# Patient Record
Sex: Female | Born: 1990 | Race: Black or African American | Hispanic: No | Marital: Single | Smoking: Former smoker
Health system: Southern US, Community
[De-identification: ages and names within clinical notes are randomized; demographics above are authoritative.]

## PROBLEM LIST (undated history)

## (undated) HISTORY — PX: TONSILLECTOMY: SUR1361

---

## 2011-10-23 ENCOUNTER — Encounter (HOSPITAL_COMMUNITY): Payer: Self-pay

## 2011-10-23 ENCOUNTER — Emergency Department (HOSPITAL_COMMUNITY)
Admission: EM | Admit: 2011-10-23 | Discharge: 2011-10-23 | Disposition: A | Discharge status: Home / Self Care | Payer: Self-pay | Attending: Emergency Medicine | Admitting: Emergency Medicine

## 2011-10-23 DIAGNOSIS — N760 Acute vaginitis: Secondary | ICD-10-CM | POA: Insufficient documentation

## 2011-10-23 DIAGNOSIS — J45909 Unspecified asthma, uncomplicated: Secondary | ICD-10-CM | POA: Insufficient documentation

## 2011-10-23 DIAGNOSIS — R197 Diarrhea, unspecified: Secondary | ICD-10-CM | POA: Insufficient documentation

## 2011-10-23 DIAGNOSIS — A499 Bacterial infection, unspecified: Secondary | ICD-10-CM | POA: Insufficient documentation

## 2011-10-23 DIAGNOSIS — B9689 Other specified bacterial agents as the cause of diseases classified elsewhere: Secondary | ICD-10-CM | POA: Insufficient documentation

## 2011-10-23 LAB — POCT PREGNANCY, URINE: Preg Test, Ur: NEGATIVE

## 2011-10-23 LAB — DIFFERENTIAL
Basophils Absolute: 0 10*3/uL (ref 0.0–0.1)
Eosinophils Relative: 1 % (ref 0–5)
Lymphocytes Relative: 31 % (ref 12–46)
Lymphs Abs: 2.7 10*3/uL (ref 0.7–4.0)
Monocytes Absolute: 0.6 10*3/uL (ref 0.1–1.0)
Monocytes Relative: 7 % (ref 3–12)
Neutro Abs: 5.2 10*3/uL (ref 1.7–7.7)

## 2011-10-23 LAB — BASIC METABOLIC PANEL
BUN: 7 mg/dL (ref 6–23)
CO2: 21 mEq/L (ref 19–32)
Calcium: 9.6 mg/dL (ref 8.4–10.5)
Chloride: 106 mEq/L (ref 96–112)
Creatinine, Ser: 0.55 mg/dL (ref 0.50–1.10)
Glucose, Bld: 117 mg/dL — ABNORMAL HIGH (ref 70–99)

## 2011-10-23 LAB — URINALYSIS, ROUTINE W REFLEX MICROSCOPIC
Bilirubin Urine: NEGATIVE
Glucose, UA: NEGATIVE mg/dL
Hgb urine dipstick: NEGATIVE
Specific Gravity, Urine: 1.03 (ref 1.005–1.030)
Urobilinogen, UA: 0.2 mg/dL (ref 0.0–1.0)

## 2011-10-23 LAB — CBC
HCT: 42 % (ref 36.0–46.0)
Hemoglobin: 13.8 g/dL (ref 12.0–15.0)
MCV: 79.8 fL (ref 78.0–100.0)
RBC: 5.26 MIL/uL — ABNORMAL HIGH (ref 3.87–5.11)
RDW: 14.3 % (ref 11.5–15.5)
WBC: 8.6 10*3/uL (ref 4.0–10.5)

## 2011-10-23 LAB — WET PREP, GENITAL
Trich, Wet Prep: NONE SEEN
Yeast Wet Prep HPF POC: NONE SEEN

## 2011-10-23 LAB — OCCULT BLOOD, POC DEVICE: Fecal Occult Bld: NEGATIVE

## 2011-10-23 MED ORDER — OXYCODONE-ACETAMINOPHEN 5-325 MG PO TABS: 1.0000 | ORAL_TABLET | ORAL | Status: DC | PRN

## 2011-10-23 MED ORDER — OXYCODONE-ACETAMINOPHEN 5-325 MG PO TABS: 1.0000 | ORAL_TABLET | ORAL | Status: AC | PRN

## 2011-10-23 MED ORDER — MORPHINE SULFATE 4 MG/ML IJ SOLN
4.0000 mg | Freq: Once | INTRAMUSCULAR | Status: AC
  Administered 2011-10-23: 4 mg via INTRAVENOUS
  Filled 2011-10-23: qty 1

## 2011-10-23 MED ORDER — METRONIDAZOLE 500 MG PO TABS: 500.0000 mg | ORAL_TABLET | Freq: Two times a day (BID) | ORAL | Status: AC

## 2011-10-23 MED ORDER — SODIUM CHLORIDE 0.9 % IV SOLN
Freq: Once | INTRAVENOUS | Status: AC
  Administered 2011-10-23: 10:00:00 via INTRAVENOUS

## 2011-10-23 MED ORDER — ONDANSETRON HCL 4 MG/2ML IJ SOLN
4.0000 mg | Freq: Once | INTRAMUSCULAR | Status: AC
  Administered 2011-10-23: 4 mg via INTRAVENOUS
  Filled 2011-10-23: qty 2

## 2011-10-23 NOTE — ED Provider Notes (Signed)
History   This chart was scribed for Susan Gaskins, MD by Clarita Crane. The patient was seen in room APA04/APA04. Patient's care was started at 0850.    CSN: [id number]  Arrival date & time 10/23/11  6644   First MD Initiated Contact with Patient 10/23/11 [phone]      Chief Complaint  Patient presents with  . Abdominal Pain    HPI Susan Curry is a 21 y.o. female who presents to the Emergency Department complaining of constant severe abdominal pain onset last night and persistent since with associated dysuria, hematochezia, nausea, diarrhea, hot flash and lower back pain. Patient reports abdominal pain is aggravated while having or attempting to have a bowel movement as well as sitting and is minimally relieved with standing. Also reports that she experiences an intense pressure within her abdomen when attempting to have a bowel movement.  Denies vomiting, fever, vaginal bleeding and h/o similar symptoms. Patient with h/o asthma and is a current smoker. Denies h/o abdominal surgery.   Past Medical History  Diagnosis Date  . Asthma     Past Surgical History  Procedure Date  . Tonsillectomy     No family history on file.  History  Substance Use Topics  . Smoking status: Current Everyday Smoker    Types: Cigarettes  . Smokeless tobacco: Not on file  . Alcohol Use: Yes     occ    OB History    Grav Para Term Preterm Abortions TAB SAB Ect Mult Living                  Review of Systems A complete 10 system review of systems was obtained and all systems are negative except as noted in the HPI and PMH.   Allergies  Review of patient's allergies indicates no known allergies.  Home Medications   Current Outpatient Rx  Name Route Sig Dispense Refill  . ALBUTEROL SULFATE HFA 108 (90 BASE) MCG/ACT IN AERS Inhalation Inhale 2 puffs into the lungs every 6 (six) hours as needed. FOR SHORTNESS OF BREATH    . ETONOGESTREL 68 MG Valders IMPL Subcutaneous Inject 1 each into  the skin once.    Marland Kitchen METRONIDAZOLE 500 MG PO TABS Oral Take 1 tablet (500 mg total) by mouth 2 (two) times daily. 14 tablet 0  . OXYCODONE-ACETAMINOPHEN 5-325 MG PO TABS Oral Take 1 tablet by mouth every 4 (four) hours as needed for pain. 5 tablet 0    BP 103/62  Pulse 58  Temp(Src) 98.1 F (36.7 C) (Oral)  Resp 18  Ht 5\' 4"  (1.626 m)  Wt 210 lb (95.255 kg)  BMI 36.05 kg/m2  SpO2 98%  Physical Exam CONSTITUTIONAL: Well developed/well nourished HEAD AND FACE: Normocephalic/atraumatic EYES: EOMI/PERRL ENMT: Mucous membranes moist NECK: supple no meningeal signs SPINE:entire spine nontender CV: S1/S2 noted, no murmurs/rubs/gallops noted LUNGS: Lungs are clear to auscultation bilaterally, no apparent distress ABDOMEN: soft, no rebound or guarding, diffuse abdominal tenderness RECTAL: chaperone present for exam, Stool color normal, no hemorrhoids noted, no mass, no abscess PELVIC: Chaperone present for exam. No cmt, no adnexal tenderness or mass, no vaginal bleeding, minimal discharge NEURO: Pt is awake/alert, moves all extremitiesx4 EXTREMITIES: pulses normal, full ROM SKIN: warm, color normal PSYCH: no abnormalities of mood noted   ED Course  Procedures   DIAGNOSTIC STUDIES: Oxygen Saturation is 100% on room air, normal by my interpretation.    COORDINATION OF CARE: 9:59AM- Rectal exam performed with chaperone present. Plan for evaluation  and treatment discussed.  10:49AM- Patient informed of current lab results. States she is feeling improved at this time following administration of 4mg  Morphine and 4mg  Zofran via IV. 11:03AM- Pelvic exam performed by Susan Gaskins, MD,  Pt improved well appearing no distress, stable for d/c The patient appears reasonably screened and/or stabilized for discharge and I doubt any other medical condition or other [facility] requiring further screening, evaluation, or treatment in the ED at this time prior to discharge.   Results for orders  placed during the hospital encounter of 10/23/11  URINALYSIS, ROUTINE W REFLEX MICROSCOPIC      Component Value Range   Color, Urine YELLOW  YELLOW    APPearance HAZY (*) CLEAR    Specific Gravity, Urine 1.030  1.005 - 1.030    pH 5.5  5.0 - 8.0    Glucose, UA NEGATIVE  NEGATIVE (mg/dL)   Hgb urine dipstick NEGATIVE  NEGATIVE    Bilirubin Urine NEGATIVE  NEGATIVE    Ketones, ur NEGATIVE  NEGATIVE (mg/dL)   Protein, ur NEGATIVE  NEGATIVE (mg/dL)   Urobilinogen, UA 0.2  0.0 - 1.0 (mg/dL)   Nitrite NEGATIVE  NEGATIVE    Leukocytes, UA NEGATIVE  NEGATIVE   POCT PREGNANCY, URINE      Component Value Range   Preg Test, Ur NEGATIVE  NEGATIVE   CBC      Component Value Range   WBC 8.6  4.0 - 10.5 (K/uL)   RBC 5.26 (*) 3.87 - 5.11 (MIL/uL)   Hemoglobin 13.8  12.0 - 15.0 (g/dL)   HCT 16.1  09.6 - 04.5 (%)   MCV 79.8  78.0 - 100.0 (fL)   MCH 26.2  26.0 - 34.0 (pg)   MCHC 32.9  30.0 - 36.0 (g/dL)   RDW 40.9  81.1 - 91.4 (%)   Platelets 258  150 - 400 (K/uL)  DIFFERENTIAL      Component Value Range   Neutrophils Relative 61  43 - 77 (%)   Neutro Abs 5.2  1.7 - 7.7 (K/uL)   Lymphocytes Relative 31  12 - 46 (%)   Lymphs Abs 2.7  0.7 - 4.0 (K/uL)   Monocytes Relative 7  3 - 12 (%)   Monocytes Absolute 0.6  0.1 - 1.0 (K/uL)   Eosinophils Relative 1  0 - 5 (%)   Eosinophils Absolute 0.1  0.0 - 0.7 (K/uL)   Basophils Relative 0  0 - 1 (%)   Basophils Absolute 0.0  0.0 - 0.1 (K/uL)  BASIC METABOLIC PANEL      Component Value Range   Sodium 138  135 - 145 (mEq/L)   Potassium 3.8  3.5 - 5.1 (mEq/L)   Chloride 106  96 - 112 (mEq/L)   CO2 21  19 - 32 (mEq/L)   Glucose, Bld 117 (*) 70 - 99 (mg/dL)   BUN 7  6 - 23 (mg/dL)   Creatinine, Ser 7.82  0.50 - 1.10 (mg/dL)   Calcium 9.6  8.4 - 95.6 (mg/dL)   GFR calc non Af Amer >90  >90 (mL/min)   GFR calc Af Amer >90  >90 (mL/min)  OCCULT BLOOD, POC DEVICE      Component Value Range   Fecal Occult Bld NEGATIVE    WET PREP, GENITAL       Component Value Range   Yeast Wet Prep HPF POC NONE SEEN  NONE SEEN    Trich, Wet Prep NONE SEEN  NONE SEEN    Clue  Cells Wet Prep HPF POC MANY (*) NONE SEEN    WBC, Wet Prep HPF POC MANY (*) NONE SEEN       1. Abdominal pain   2. Bacterial vaginosis   3. Diarrhea       MDM  Nursing notes reviewed and considered in documentation All labs/vitals reviewed and considered       I personally performed the services described in this documentation, which was scribed in my presence. The recorded information has been reviewed and considered.      Susan Gaskins, MD 10/23/11 1136

## 2011-10-23 NOTE — ED Notes (Signed)
Water given  

## 2011-10-23 NOTE — Discharge Instructions (Signed)

## 2011-10-23 NOTE — ED Notes (Signed)
Attempted to call for triage was n restroom.

## 2011-10-23 NOTE — ED Notes (Signed)
Ab pain that started last night, last normal bm 3 days ago, having "trouble w/ bowels", "feels like she has to go but nothing comes out and when it does its hard"

## 2013-08-25 ENCOUNTER — Emergency Department (HOSPITAL_COMMUNITY)
Admission: EM | Admit: 2013-08-25 | Discharge: 2013-08-25 | Disposition: A | Discharge status: Home / Self Care | Payer: Managed Care, Other (non HMO) | Attending: Emergency Medicine | Admitting: Emergency Medicine

## 2013-08-25 ENCOUNTER — Encounter (HOSPITAL_COMMUNITY): Payer: Self-pay | Admitting: Emergency Medicine

## 2013-08-25 ENCOUNTER — Emergency Department (HOSPITAL_COMMUNITY): Payer: Managed Care, Other (non HMO)

## 2013-08-25 DIAGNOSIS — J209 Acute bronchitis, unspecified: Secondary | ICD-10-CM | POA: Insufficient documentation

## 2013-08-25 DIAGNOSIS — Z79899 Other long term (current) drug therapy: Secondary | ICD-10-CM | POA: Insufficient documentation

## 2013-08-25 DIAGNOSIS — R51 Headache: Secondary | ICD-10-CM | POA: Insufficient documentation

## 2013-08-25 DIAGNOSIS — J40 Bronchitis, not specified as acute or chronic: Secondary | ICD-10-CM

## 2013-08-25 DIAGNOSIS — F17200 Nicotine dependence, unspecified, uncomplicated: Secondary | ICD-10-CM | POA: Insufficient documentation

## 2013-08-25 DIAGNOSIS — J45909 Unspecified asthma, uncomplicated: Secondary | ICD-10-CM | POA: Insufficient documentation

## 2013-08-25 MED ORDER — HYDROCODONE-ACETAMINOPHEN 5-325 MG PO TABS
1.0000 | ORAL_TABLET | Freq: Once | ORAL | Status: AC
  Administered 2013-08-25: 1 via ORAL
  Filled 2013-08-25: qty 1

## 2013-08-25 MED ORDER — HYDROCODONE-ACETAMINOPHEN 5-325 MG PO TABS: 1.0000 | ORAL_TABLET | Freq: Four times a day (QID) | ORAL | Status: DC | PRN

## 2013-08-25 MED ORDER — AMOXICILLIN 500 MG PO CAPS: 500.0000 mg | ORAL_CAPSULE | Freq: Three times a day (TID) | ORAL | Status: DC

## 2013-08-25 NOTE — ED Notes (Signed)
Pt c/o headache, being "cold", n/v X1, chest pain that started while walking her daughter to school this am, pt reports that her daughter has been sick with cold symptoms as well,

## 2013-08-25 NOTE — ED Notes (Signed)
Sudden onset of left sided chest tightness and headache while on the way to work this morning.  Denies sob/cough/fever.

## 2013-08-25 NOTE — ED Provider Notes (Signed)
CSN: [id number]     Arrival date & time 08/25/13  0718 History   First MD Initiated Contact with Patient 08/25/13 [phone]     Chief Complaint  Patient presents with  . Chest Pain  . Headache     (Consider location/radiation/quality/duration/timing/severity/associated sxs/prior Treatment) Patient is a 23 y.o. female presenting with chest pain and headaches. The history is provided by the patient (the pt complains of cough).  Chest Pain Pain location:  L chest Pain quality: aching   Pain radiates to:  Does not radiate Pain radiates to the back: no   Pain severity:  Mild Onset quality:  Gradual Timing:  Intermittent Progression:  Waxing and waning Associated symptoms: cough and headache   Associated symptoms: no abdominal pain, no back pain and no fatigue   Headache Associated symptoms: cough   Associated symptoms: no abdominal pain, no back pain, no congestion, no diarrhea, no fatigue, no seizures and no sinus pressure     Past Medical History  Diagnosis Date  . Asthma    Past Surgical History  Procedure Laterality Date  . Tonsillectomy     No family history on file. History  Substance Use Topics  . Smoking status: Current Every Day Smoker    Types: Cigarettes  . Smokeless tobacco: Not on file  . Alcohol Use: Yes     Comment: occ   OB History   Grav Para Term Preterm Abortions TAB SAB Ect Mult Living                 Review of Systems  Constitutional: Negative for appetite change and fatigue.  HENT: Negative for congestion, ear discharge and sinus pressure.   Eyes: Negative for discharge.  Respiratory: Positive for cough.   Cardiovascular: Positive for chest pain.  Gastrointestinal: Negative for abdominal pain and diarrhea.  Genitourinary: Negative for frequency and hematuria.  Musculoskeletal: Negative for back pain.  Skin: Negative for rash.  Neurological: Positive for headaches. Negative for seizures.  Psychiatric/Behavioral: Negative for hallucinations.       Allergies  Review of patient's allergies indicates no known allergies.  Home Medications   Current Outpatient Rx  Name  Route  Sig  Dispense  Refill  . albuterol (PROVENTIL HFA;VENTOLIN HFA) 108 (90 BASE) MCG/ACT inhaler   Inhalation   Inhale 2 puffs into the lungs every 6 (six) hours as needed. FOR SHORTNESS OF BREATH         . amoxicillin (AMOXIL) 500 MG capsule   Oral   Take 1 capsule (500 mg total) by mouth 3 (three) times daily.   21 capsule   0   . etonogestrel (IMPLANON) 68 MG IMPL implant   Subcutaneous   Inject 1 each into the skin once.         Marland Kitchen HYDROcodone-acetaminophen (NORCO/VICODIN) 5-325 MG per tablet   Oral   Take 1 tablet by mouth every 6 (six) hours as needed for moderate pain.   15 tablet   0    BP 108/62  Pulse 70  Temp(Src) 97.9 F (36.6 C) (Oral)  Resp 16  Ht 5\' 4"  (1.626 m)  Wt 180 lb (81.647 kg)  BMI 30.88 kg/m2  SpO2 100%  LMP 08/06/2013 Physical Exam  Constitutional: She is oriented to person, place, and time. She appears well-developed.  HENT:  Head: Normocephalic.  Eyes: Conjunctivae and EOM are normal. No scleral icterus.  Neck: Neck supple. No thyromegaly present.  Cardiovascular: Normal rate and regular rhythm.  Exam reveals no gallop and  no friction rub.   No murmur heard. Pulmonary/Chest: No stridor. She has no wheezes. She has no rales. She exhibits no tenderness.  Abdominal: She exhibits no distension. There is no tenderness. There is no rebound.  Musculoskeletal: Normal range of motion. She exhibits no edema.  Lymphadenopathy:    She has no cervical adenopathy.  Neurological: She is oriented to person, place, and time. She exhibits normal muscle tone. Coordination normal.  Skin: No rash noted. No erythema.  Psychiatric: She has a normal mood and affect. Her behavior is normal.    ED Course  Procedures (including critical care time) Labs Review Labs Reviewed - No data to display Imaging Review Dg Chest 2  View  08/25/2013   CLINICAL DATA:  Chills and left-sided chest pain.  EXAM: CHEST  2 VIEW  COMPARISON:  None.  FINDINGS: The lungs are adequately inflated and clear. The cardiac silhouette is normal in size. The pulmonary vascularity is not engorged. There is no pleural effusion or pneumothorax. The trachea is midline. The observed portions of the bony thorax appear normal.  IMPRESSION: There is no evidence of pneumonia nor other acute cardiopulmonary disease. One cannot exclude acute bronchitis in the appropriate clinical setting.   Electronically Signed   By: David  Swazil[place]   On: 08/25/2013 08:11     EKG Interpretation   Date/Time:  Tuesday August 25 2013 07:37:18 EDT Ventricular Rate:  66 PR Interval:  146 QRS Duration: 88 QT Interval:  390 QTC Calculation: 408 R Axis:   83 Text Interpretation:  Normal sinus rhythm Normal ECG No previous ECGs  available Confirmed by Nesha Counihan  MD, Raymundo Rout ([id number]) on 08/25/2013 8:20:07 AM      MDM   Final diagnoses:  Bronchitis        Benny LennertJoseph L Cortlynn Hollinsworth, MD 08/25/13 [phone]

## 2013-08-25 NOTE — Discharge Instructions (Signed)
Follow if not improving.  Plenty of fluids.  Tylenol for fever

## 2016-03-05 ENCOUNTER — Other Ambulatory Visit: Payer: Self-pay | Admitting: Medical Oncology

## 2016-03-05 DIAGNOSIS — N6011 Diffuse cystic mastopathy of right breast: Secondary | ICD-10-CM

## 2016-03-05 DIAGNOSIS — N6012 Diffuse cystic mastopathy of left breast: Principal | ICD-10-CM

## 2016-06-26 ENCOUNTER — Encounter: Payer: Self-pay | Admitting: Emergency Medicine

## 2016-06-26 ENCOUNTER — Ambulatory Visit
Admission: EM | Admit: 2016-06-26 | Discharge: 2016-06-26 | Disposition: A | Discharge status: Home / Self Care | Payer: BC Managed Care – PPO | Attending: Family Medicine | Admitting: Family Medicine

## 2016-06-26 DIAGNOSIS — Z3A01 Less than 8 weeks gestation of pregnancy: Secondary | ICD-10-CM

## 2016-06-26 DIAGNOSIS — R112 Nausea with vomiting, unspecified: Secondary | ICD-10-CM

## 2016-06-26 DIAGNOSIS — Z3201 Encounter for pregnancy test, result positive: Secondary | ICD-10-CM

## 2016-06-26 DIAGNOSIS — R110 Nausea: Secondary | ICD-10-CM | POA: Diagnosis not present

## 2016-06-26 LAB — PREGNANCY, URINE: PREG TEST UR: POSITIVE — AB

## 2016-06-26 MED ORDER — ONDANSETRON 8 MG PO TBDP: 8.0000 mg | ORAL_TABLET | Freq: Two times a day (BID) | ORAL | 0 refills | Status: DC

## 2016-06-26 NOTE — ED Provider Notes (Signed)
CSN: [id number]     Arrival date & time 06/26/16  1611 History   First MD Initiated Contact with Patient 06/26/16 1739     Chief Complaint  Patient presents with  . Nausea   (Consider location/radiation/quality/duration/timing/severity/associated sxs/prior Treatment) HPI  The 26 year old female resents with nausea vomiting and weakness for 1 week. As the nausea vomiting she has also anorectic. Been complaining of dizziness and lightheadedness as well as some lower abdominal cramping. She's had no vaginal discharge no spotting no fever no diarrhea her last menstrual period was the beginning of December. Currently has no OB/GYN. I primary care is through [facility]       Past Medical History:  Diagnosis Date  . Asthma    Past Surgical History:  Procedure Laterality Date  . TONSILLECTOMY     History reviewed. No pertinent family history. Social History  Substance Use Topics  . Smoking status: Current Every Day Smoker    Types: Cigarettes  . Smokeless tobacco: Never Used  . Alcohol use Yes     Comment: occ   OB History    Gravida Para Term Preterm AB Living   1             SAB TAB Ectopic Multiple Live Births                 Review of Systems  Constitutional: Positive for activity change, appetite change and fever.  Gastrointestinal: Positive for nausea. Negative for diarrhea.  All other systems reviewed and are negative.   Allergies  Patient has no known allergies.  Home Medications   Prior to Admission medications   Medication Sig Start Date End Date Taking? Authorizing Provider  ondansetron (ZOFRAN ODT) 8 MG disintegrating tablet Take 1 tablet (8 mg total) by mouth 2 (two) times daily. 06/26/16   Lutricia FeilWilliam P Truth Barot, PA-C   Meds Ordered and Administered this Visit  Medications - No data to display  BP 103/61 (BP Location: Left Arm)   Pulse 68   Temp 97.5 F (36.4 C) (Tympanic)   Resp 16   Ht 5\' 4"  (1.626 m)   Wt 215 lb (97.5 kg)   LMP 05/03/2016    SpO2 100%   BMI 36.90 kg/m  No data found.   Physical Exam  Constitutional: She is oriented to person, place, and time. She appears well-developed and well-nourished. No distress.  HENT:  Head: Normocephalic and atraumatic.  Eyes: EOM are normal. Pupils are equal, round, and reactive to light.  Neck: Normal range of motion. Neck supple.  Pulmonary/Chest: Effort normal and breath sounds normal. No respiratory distress. She has no wheezes. She has no rales.  Abdominal: Soft. Bowel sounds are normal. She exhibits no distension. There is no tenderness. There is no guarding.  Musculoskeletal: Normal range of motion.  Neurological: She is alert and oriented to person, place, and time.  Skin: Skin is warm and dry. She is not diaphoretic.  Psychiatric: She has a normal mood and affect. Her behavior is normal. Judgment and thought content normal.  Nursing note and vitals reviewed.   Urgent Care Course     Procedures (including critical care time)  Labs Review Labs Reviewed  PREGNANCY, URINE - Abnormal; Notable for the following:       Result Value   Preg Test, Ur POSITIVE (*)    All other components within normal limits    Imaging Review No results found.   Visual Acuity Review  Right Eye Distance:  Left Eye Distance:   Bilateral Distance:    Right Eye Near:   Left Eye Near:    Bilateral Near:         MDM   1. Nausea   2. Non-intractable vomiting with nausea, unspecified vomiting type   3. Less than [redacted] weeks gestation of pregnancy    Discharge Medication List as of 06/26/2016  5:58 PM    START taking these medications   Details  ondansetron (ZOFRAN ODT) 8 MG disintegrating tablet Take 1 tablet (8 mg total) by mouth 2 (two) times daily., Starting Tue 06/26/2016, Normal      Plan: 1. Test/x-ray results and diagnosis reviewed with patient 2. rx as per orders; risks, benefits, potential side effects reviewed with patient 3. Recommend supportive treatment with  Consulting with an OB/GYN as soon as possible. Stop the melatonin at this time since it is not recommended in early pregnancy. I will prescribe Zofran for her nausea and vomiting. 4. F/u prn if symptoms worsen or don't improve     Lutricia Feil, PA-C 06/26/16 2135

## 2016-06-26 NOTE — ED Triage Notes (Signed)
Nausea, vomiting for over a week

## 2016-07-03 DIAGNOSIS — R87610 Atypical squamous cells of undetermined significance on cytologic smear of cervix (ASC-US): Secondary | ICD-10-CM | POA: Insufficient documentation

## 2016-07-03 DIAGNOSIS — O200 Threatened abortion: Secondary | ICD-10-CM | POA: Insufficient documentation

## 2016-07-10 ENCOUNTER — Ambulatory Visit: Payer: Managed Care, Other (non HMO) | Attending: Medical Oncology

## 2016-07-24 ENCOUNTER — Encounter: Payer: Self-pay | Admitting: Medical Oncology

## 2016-07-24 ENCOUNTER — Emergency Department: Payer: BC Managed Care – PPO

## 2016-07-24 ENCOUNTER — Emergency Department
Admission: EM | Admit: 2016-07-24 | Discharge: 2016-07-24 | Disposition: A | Discharge status: Home / Self Care | Payer: BC Managed Care – PPO | Attending: Emergency Medicine | Admitting: Emergency Medicine

## 2016-07-24 DIAGNOSIS — F17210 Nicotine dependence, cigarettes, uncomplicated: Secondary | ICD-10-CM | POA: Diagnosis not present

## 2016-07-24 DIAGNOSIS — J45909 Unspecified asthma, uncomplicated: Secondary | ICD-10-CM | POA: Insufficient documentation

## 2016-07-24 DIAGNOSIS — R197 Diarrhea, unspecified: Secondary | ICD-10-CM

## 2016-07-24 DIAGNOSIS — O219 Vomiting of pregnancy, unspecified: Secondary | ICD-10-CM | POA: Diagnosis present

## 2016-07-24 DIAGNOSIS — O200 Threatened abortion: Secondary | ICD-10-CM | POA: Diagnosis not present

## 2016-07-24 DIAGNOSIS — O99331 Smoking (tobacco) complicating pregnancy, first trimester: Secondary | ICD-10-CM | POA: Insufficient documentation

## 2016-07-24 DIAGNOSIS — R112 Nausea with vomiting, unspecified: Secondary | ICD-10-CM

## 2016-07-24 LAB — COMPREHENSIVE METABOLIC PANEL
ALT: 35 U/L (ref 14–54)
ANION GAP: 9 (ref 5–15)
AST: 26 U/L (ref 15–41)
Albumin: 3.8 g/dL (ref 3.5–5.0)
Alkaline Phosphatase: 55 U/L (ref 38–126)
BILIRUBIN TOTAL: 0.6 mg/dL (ref 0.3–1.2)
CO2: 23 mmol/L (ref 22–32)
Calcium: 9.3 mg/dL (ref 8.9–10.3)
Chloride: 104 mmol/L (ref 101–111)
Creatinine, Ser: 0.55 mg/dL (ref 0.44–1.00)
Glucose, Bld: 107 mg/dL — ABNORMAL HIGH (ref 65–99)
POTASSIUM: 3.5 mmol/L (ref 3.5–5.1)
Sodium: 136 mmol/L (ref 135–145)
TOTAL PROTEIN: 7.7 g/dL (ref 6.5–8.1)

## 2016-07-24 LAB — HCG, QUANTITATIVE, PREGNANCY: HCG, BETA CHAIN, QUANT, S: 88610 m[IU]/mL — AB (ref <5)

## 2016-07-24 LAB — LIPASE, BLOOD: Lipase: 21 U/L (ref 11–51)

## 2016-07-24 LAB — URINALYSIS, COMPLETE (UACMP) WITH MICROSCOPIC
BACTERIA UA: NONE SEEN
BILIRUBIN URINE: NEGATIVE
Glucose, UA: NEGATIVE mg/dL
HGB URINE DIPSTICK: NEGATIVE
Ketones, ur: 80 mg/dL — AB
Leukocytes, UA: NEGATIVE
Nitrite: NEGATIVE
Protein, ur: 30 mg/dL — AB
SPECIFIC GRAVITY, URINE: 1.025 (ref 1.005–1.030)
pH: 5 (ref 5.0–8.0)

## 2016-07-24 LAB — CBC
HEMATOCRIT: 39.9 % (ref 35.0–47.0)
HEMOGLOBIN: 13.3 g/dL (ref 12.0–16.0)
MCH: 25.8 pg — ABNORMAL LOW (ref 26.0–34.0)
MCHC: 33.3 g/dL (ref 32.0–36.0)
MCV: 77.5 fL — ABNORMAL LOW (ref 80.0–100.0)
Platelets: 278 10*3/uL (ref 150–440)
RBC: 5.14 MIL/uL (ref 3.80–5.20)
RDW: 14.6 % — AB (ref 11.5–14.5)
WBC: 8.3 10*3/uL (ref 3.6–11.0)

## 2016-07-24 LAB — ABO/RH: ABO/RH(D): B POS

## 2016-07-24 MED ORDER — ACETAMINOPHEN 500 MG PO TABS
1000.0000 mg | ORAL_TABLET | Freq: Once | ORAL | Status: AC
  Administered 2016-07-24: 1000 mg via ORAL
  Filled 2016-07-24: qty 2

## 2016-07-24 MED ORDER — SODIUM CHLORIDE 0.9 % IV BOLUS (SEPSIS)
1000.0000 mL | INTRAVENOUS | Status: AC
  Administered 2016-07-24: 1000 mL via INTRAVENOUS

## 2016-07-24 MED ORDER — ONDANSETRON HCL 4 MG/2ML IJ SOLN
4.0000 mg | INTRAMUSCULAR | Status: AC
  Administered 2016-07-24: 4 mg via INTRAVENOUS
  Filled 2016-07-24: qty 2

## 2016-07-24 NOTE — ED Notes (Signed)
Pt unable to urinate at this time, given specimen cup for when is able to void and sent back out to lobby.

## 2016-07-24 NOTE — ED Provider Notes (Signed)
[facility] Emergency Department Provider Note  ____________________________________________   First MD Initiated Contact with Patient 07/24/16 1538     (approximate)  I have reviewed the triage vital signs and the nursing notes.   HISTORY  Chief Complaint Emesis    HPI Susan Curry is a 26 y.o. female G2 P1 at [redacted] weeks gestation who goes to an OB/GYN in [place].  She presents for evaluation of a constellation of symptoms including vomiting, diarrhea, and some vaginal spotting.  She reports that she has had severe nausea and vomiting throughout her pregnancy and takes both Zofran and Diclegis.   She reports that multiple members of her family had diarrhea recently and they assume that they ate something bad or have a virus, but she woke up acutely at 5:30 AM with some mild abdominal cramping, diarrhea, and persistent vomiting.  She reports that she has had at least 12 episodes of vomiting and diarrhea today.  What concerned her was that she had some vaginal spotting as well.  She noticed the blood when she wiped after urinating.  She thinks that she might of had some blood in her stool as well but since she is pregnant and she is most concerned about the possibility of vaginal blood.  She denies fever/chills, chest pain, shortness of breath.  She states the symptoms of vomiting and diarrhea are severe but the vaginal bleeding is mild.  Nothing makes her symptoms better or worse.  She also reports a generalized headache that is severe and throbbing.  Past Medical History:  Diagnosis Date  . Asthma     There are no active problems to display for this patient.   Past Surgical History:  Procedure Laterality Date  . TONSILLECTOMY      Prior to Admission medications   Medication Sig Start Date End Date Taking? Authorizing Provider  ondansetron (ZOFRAN ODT) 8 MG disintegrating tablet Take 1 tablet (8 mg total) by mouth 2 (two) times daily. 06/26/16    Lutricia Feil, PA-C    Allergies Patient has no known allergies.  No family history on file.  Social History Social History  Substance Use Topics  . Smoking status: Current Every Day Smoker    Types: Cigarettes  . Smokeless tobacco: Never Used  . Alcohol use Yes     Comment: occ    Review of Systems Constitutional: No fever/chills Eyes: No visual changes. ENT: No sore throat. Cardiovascular: Denies chest pain. Respiratory: Denies shortness of breath. Gastrointestinal: Mild abdominal cramping with numerous episodes of N/V/D since during the night last night Genitourinary: Several episodes of spotting.  Negative for dysuria. Musculoskeletal: Negative for back pain. Skin: Negative for rash. Neurological: Negative for headaches, focal weakness or numbness.  10-point ROS otherwise negative.  ____________________________________________   PHYSICAL EXAM:  VITAL SIGNS: ED Triage Vitals [07/24/16 1347]  Enc Vitals Group     BP (!) 141/105     Pulse Rate 97     Resp 18     Temp 98.7 F (37.1 C)     Temp Source Oral     SpO2 99 %     Weight 198 lb (89.8 kg)     Height 5\' 4"  (1.626 m)     Head Circumference      Peak Flow      Pain Score 10     Pain Loc      Pain Edu?      Excl. in GC?  Constitutional: Alert and oriented. Well appearing and in no acute distress. Eyes: Conjunctivae are normal. PERRL. EOMI. Head: Atraumatic. Nose: No congestion/rhinnorhea. Mouth/Throat: Mucous membranes are moist. Neck: No stridor.  No meningeal signs.   Cardiovascular: Normal rate, regular rhythm. Good peripheral circulation. Grossly normal heart sounds. Respiratory: Normal respiratory effort.  No retractions. Lungs CTAB. Gastrointestinal: Soft with diffuse tenderness to palpation throughout, But no specific focal tenderness Genitourinary: Deferred Musculoskeletal: No lower extremity tenderness nor edema. No gross deformities of extremities. Neurologic:  Normal speech and  language. No gross focal neurologic deficits are appreciated.  Skin:  Skin is warm, dry and intact. No rash noted. Psychiatric: Mood and affect are normal. Speech and behavior are normal.  ____________________________________________   LABS (all labs ordered are listed, but only abnormal results are displayed)  Labs Reviewed  COMPREHENSIVE METABOLIC PANEL - Abnormal; Notable for the following:       Result Value   Glucose, Bld 107 (*)    BUN <5 (*)    All other components within normal limits  CBC - Abnormal; Notable for the following:    MCV 77.5 (*)    MCH 25.8 (*)    RDW 14.6 (*)    All other components within normal limits  URINALYSIS, COMPLETE (UACMP) WITH MICROSCOPIC - Abnormal; Notable for the following:    Color, Urine AMBER (*)    APPearance HAZY (*)    Ketones, ur 80 (*)    Protein, ur 30 (*)    Squamous Epithelial / LPF 6-30 (*)    All other components within normal limits  HCG, QUANTITATIVE, PREGNANCY - Abnormal; Notable for the following:    hCG, Beta Chain, Quant, S 88,610 (*)    All other components within normal limits  LIPASE, BLOOD  ABO/RH   ____________________________________________  EKG None - EKG not ordered by ED physician  ____________________________________________  RADIOLOGY   No results found.  ____________________________________________   PROCEDURES  Procedure(s) performed:   Procedures   Critical Care performed: No ____________________________________________   INITIAL IMPRESSION / ASSESSMENT AND PLAN / ED COURSE  Pertinent labs & imaging results that were available during my care of the patient were reviewed by me and considered in my medical decision making (see chart for details).  The patient has a reassuring lab workup and no evidence of infection on urinalysis.  She does have some ketones in her urine and based on the numerous episodes of vomiting and diarrhea appears appropriate here some IV fluids and Zofran.  My  main concern is whether or not she is having vaginal bleeding during pregnancy.  We discussed this and agreed that we would obtain ultrasounds.  She is quite confident of the dates of [redacted] weeks gestation, so I ordered transabdominal and transvaginal.  HCG is pending as is ABO/Rh.  If she is able to tolerate liquids and has a reassuring ultrasound, she should be able to be discharged to follow up as an outpatient after some IV hydration.   Clinical Course as of Jul 24 1708  Tue Jul 24, 2016  1644 Transferring ED care to Dr. Lenard Lance at 5:00p to follow up ABO/Rh, hCG, and ultrasounds.  [CF]    Clinical Course User Index [CF] Loleta Rose, MD    ____________________________________________  FINAL CLINICAL IMPRESSION(S) / ED DIAGNOSES  Final diagnoses:  Threatened miscarriage in early pregnancy  Nausea vomiting and diarrhea     MEDICATIONS GIVEN DURING THIS VISIT:  Medications  sodium chloride 0.9 % bolus 1,000 mL (1,000 mLs  Intravenous New Bag/Given 07/24/16 1627)  ondansetron (ZOFRAN) injection 4 mg (4 mg Intravenous Given 07/24/16 1627)  acetaminophen (TYLENOL) tablet 1,000 mg (1,000 mg Oral Given 07/24/16 1627)     NEW OUTPATIENT MEDICATIONS STARTED DURING THIS VISIT:  New Prescriptions   No medications on file    Modified Medications   No medications on file    Discontinued Medications   No medications on file     Note:  This document was prepared using Dragon voice recognition software and may include unintentional dictation errors.    Loleta Roseory Zenovia Justman, MD 07/24/16 1710

## 2016-07-24 NOTE — ED Provider Notes (Signed)
-----------------------------------------   5:36 PM on 07/24/2016 -----------------------------------------  Ultrasound consistent with 10 week 5 day intrauterine pregnancy. Patient's blood type is B+, no RhoGAM required. Patient has received IV fluids in the emergency department, she is already on antiemetics at home. Patient is feeling better. We'll discharge home at this time with OB/GYN follow-up.   Minna AntisKevin Claris Guymon, MD 07/24/16 [phone]

## 2016-07-24 NOTE — ED Notes (Signed)
esignature not working on this computer

## 2016-07-24 NOTE — ED Triage Notes (Signed)
Pt reports she thinks she ate something bad last night bc her whole family had diarrhea, but pt is 3 months preg and has been vomiting also.

## 2016-08-07 DIAGNOSIS — O210 Mild hyperemesis gravidarum: Secondary | ICD-10-CM | POA: Insufficient documentation

## 2016-09-04 DIAGNOSIS — E669 Obesity, unspecified: Secondary | ICD-10-CM | POA: Insufficient documentation

## 2016-11-29 DIAGNOSIS — Z9181 History of falling: Secondary | ICD-10-CM | POA: Insufficient documentation

## 2016-12-27 DIAGNOSIS — O4703 False labor before 37 completed weeks of gestation, third trimester: Secondary | ICD-10-CM | POA: Insufficient documentation

## 2016-12-31 DIAGNOSIS — W19XXXD Unspecified fall, subsequent encounter: Secondary | ICD-10-CM | POA: Insufficient documentation

## 2017-01-22 DIAGNOSIS — O99213 Obesity complicating pregnancy, third trimester: Secondary | ICD-10-CM | POA: Insufficient documentation

## 2017-09-04 ENCOUNTER — Ambulatory Visit
Admission: EM | Admit: 2017-09-04 | Discharge: 2017-09-04 | Disposition: A | Discharge status: Home / Self Care | Payer: BC Managed Care – PPO | Attending: Family Medicine | Admitting: Family Medicine

## 2017-09-04 DIAGNOSIS — L03113 Cellulitis of right upper limb: Secondary | ICD-10-CM

## 2017-09-04 MED ORDER — SULFAMETHOXAZOLE-TRIMETHOPRIM 800-160 MG PO TABS: 1.0000 | ORAL_TABLET | Freq: Two times a day (BID) | ORAL | 0 refills | Status: DC

## 2017-09-04 NOTE — Discharge Instructions (Signed)
Warm compresses to area °

## 2017-09-04 NOTE — ED Triage Notes (Signed)
Pt having swelling in right arm, itching, shooting pains and skin feels as if it's "hardening" has been going on for a week. Pt has tried hydrocortisone and benadryl without relief.

## 2017-09-04 NOTE — ED Provider Notes (Signed)
[facility]    CSN: [id number] Arrival date & time: 09/04/17  1922     History   Chief Complaint Chief Complaint  Patient presents with  . Arm Swelling    HPI Susan Curry is a 27 y.o. female.   HPI  Past Medical History:  Diagnosis Date  . Asthma     There are no active problems to display for this patient.   Past Surgical History:  Procedure Laterality Date  . TONSILLECTOMY      OB History    Gravida  1   Para      Term      Preterm      AB      Living        SAB      TAB      Ectopic      Multiple      Live Births               Home Medications    Prior to Admission medications   Medication Sig Start Date End Date Taking? Authorizing Provider  ondansetron (ZOFRAN ODT) 8 MG disintegrating tablet Take 1 tablet (8 mg total) by mouth 2 (two) times daily. 06/26/16   Lutricia Feiloemer, William P, PA-C  sulfamethoxazole-trimethoprim (BACTRIM DS,SEPTRA DS) 800-160 MG tablet Take 1 tablet by mouth 2 (two) times daily. 09/04/17   Payton Mccallumonty, Deantre Bourdon, MD    Family History No family history on file.  Social History Social History   Tobacco Use  . Smoking status: Current Every Day Smoker    Types: Cigarettes  . Smokeless tobacco: Never Used  Substance Use Topics  . Alcohol use: Yes    Comment: occ  . Drug use: No     Allergies   Patient has no known allergies.   Review of Systems Review of Systems   Physical Exam Triage Vital Signs ED Triage Vitals  Enc Vitals Group     BP 09/04/17 1931 125/68     Pulse Rate 09/04/17 1931 86     Resp 09/04/17 1931 18     Temp 09/04/17 1931 98.6 F (37 C)     Temp Source 09/04/17 1931 Oral     SpO2 09/04/17 1931 100 %     Weight --      Height --      Head Circumference --      Peak Flow --      Pain Score 09/04/17 1933 8     Pain Loc --      Pain Edu? --      Excl. in GC? --    No data found.  Updated Vital Signs BP 125/68 (BP Location: Left Arm)   Pulse 86   Temp 98.6 F  (37 C) (Oral)   Resp 18   LMP 08/11/2017 (Exact Date)   SpO2 100%   Breastfeeding? No   Visual Acuity Right Eye Distance:   Left Eye Distance:   Bilateral Distance:    Right Eye Near:   Left Eye Near:    Bilateral Near:     Physical Exam  Constitutional: She appears well-developed and well-nourished. No distress.  Skin: She is not diaphoretic. There is erythema.  Right distal forearm skin with 3cm indurated area with surrounding blanchable erythema, warmth and tenderness to palpation extending toward the proximal forearm  Nursing note and vitals reviewed.    UC Treatments / Results  Labs (all labs ordered are listed, but only abnormal results are  displayed) Labs Reviewed - No data to display  EKG None Radiology No results found.  Procedures Procedures (including critical care time)  Medications Ordered in UC Medications - No data to display   Initial Impression / Assessment and Plan / UC Course  I have reviewed the triage vital signs and the nursing notes.  Pertinent labs & imaging results that were available during my care of the patient were reviewed by me and considered in my medical decision making (see chart for details).       Final Clinical Impressions(s) / UC Diagnoses   Final diagnoses:  Cellulitis of arm, right    ED Discharge Orders        Ordered    sulfamethoxazole-trimethoprim (BACTRIM DS,SEPTRA DS) 800-160 MG tablet  2 times daily     09/04/17 1941     1. diagnosis reviewed with patient 2. rx as per orders above; reviewed possible side effects, interactions, risks and benefits  3. Recommend supportive treatment with warm compresses to area, otc analgesics prn 4. Follow-up prn if symptoms worsen or don't improve  Controlled Substance Prescriptions [place] Controlled Substance Registry consulted? Not Applicable   Payton Mccallum, MD 09/04/17 2024

## 2017-10-21 ENCOUNTER — Encounter: Payer: Self-pay | Admitting: Emergency Medicine

## 2017-10-21 ENCOUNTER — Ambulatory Visit
Admission: EM | Admit: 2017-10-21 | Discharge: 2017-10-21 | Disposition: A | Discharge status: Home / Self Care | Payer: BC Managed Care – PPO | Attending: Family Medicine | Admitting: Family Medicine

## 2017-10-21 ENCOUNTER — Ambulatory Visit (INDEPENDENT_AMBULATORY_CARE_PROVIDER_SITE_OTHER)
Admit: 2017-10-21 | Discharge: 2017-10-21 | Disposition: A | Discharge status: Home / Self Care | Payer: BC Managed Care – PPO | Attending: *Deleted | Admitting: *Deleted

## 2017-10-21 ENCOUNTER — Other Ambulatory Visit: Payer: Self-pay

## 2017-10-21 DIAGNOSIS — L03113 Cellulitis of right upper limb: Secondary | ICD-10-CM

## 2017-10-21 DIAGNOSIS — M79601 Pain in right arm: Secondary | ICD-10-CM

## 2017-10-21 MED ORDER — MUPIROCIN 2 % EX OINT: TOPICAL_OINTMENT | CUTANEOUS | 0 refills | Status: AC

## 2017-10-21 MED ORDER — DOXYCYCLINE HYCLATE 100 MG PO CAPS: 100.0000 mg | ORAL_CAPSULE | Freq: Two times a day (BID) | ORAL | 0 refills | Status: AC

## 2017-10-21 NOTE — ED Triage Notes (Signed)
Patient states her right arm started itching really bad on Saturday.  Yesterday patient noticed her arm was swelling and getting red. Today it is very painful and weak

## 2017-10-21 NOTE — Discharge Instructions (Addendum)
Take medication as prescribed. Rest. Drink plenty of fluids. Have ultrasound completed as discussed.   Follow up with your primary care physician this week as needed. Return to Urgent care for new or worsening concerns.

## 2017-10-21 NOTE — ED Provider Notes (Addendum)
[facility] ____________________________________________  Time seen: Approximately 2:43 PM  I have reviewed the triage vital signs and the nursing notes.   HISTORY  Chief Complaint Insect Bite   HPI Susan Curry is a 27 y.o. female presenting for evaluation of right upper extremity redness and swelling.  Patient reports that this started on Saturday.  States that she had one area that looks more like an insect bite with some redness immediately around it and reports that it was itching.  Reports yesterday and today the arm continues to have a larger area of redness with sensation of swelling.  States arm also feels heavy and with some discomfort.  States left hand dominant.  Reports right hand grip slightly weaker, but continues to remain active with arm.  Denies tick bite or tick attachment.  States that she believes it may have been a mosquito bite but unsure.  Reports twice this year she has had an issue similarly after having what she believes was an insect bite and the area became very red.  Denies any history of blood clots.  States current is similar to previous, except for the area is painful and more swollen.  Not on birth control.  Positive smoker.  Denies associated chest pain, shortness of breath, hemoptysis or other complaints.  Has been keeping the area clean, no other alleviating measures attempted.  Denies other aggravating factors.  Reports otherwise feels well. Denies recent sickness.  States in the past she was treated with Bactrim and had a more diffuse rash, but unsure if it was medication allergy or not.  [place]: PCP  Denies pregnancy.   Past Medical History:  Diagnosis Date  . Asthma     There are no active problems to display for this patient.   Past Surgical History:  Procedure Laterality Date  . TONSILLECTOMY       No current facility-administered medications for this encounter.   Current Outpatient Medications:    .  doxycycline (VIBRAMYCIN) 100 MG capsule, Take 1 capsule (100 mg total) by mouth 2 (two) times daily., Disp: 20 capsule, Rfl: 0 .  mupirocin ointment (BACTROBAN) 2 %, Apply two times a day for 7 days., Disp: 22 g, Rfl: 0  Allergies Patient has no known allergies.  Family History  Problem Relation Age of Onset  . Cancer Paternal Grandfather     Social History Social History   Tobacco Use  . Smoking status: Current Every Day Smoker    Types: Cigarettes  . Smokeless tobacco: Never Used  Substance Use Topics  . Alcohol use: Not Currently  . Drug use: No    Review of Systems Constitutional: No fever/chills Eyes: No visual changes. ENT: No sore throat. Cardiovascular: Denies chest pain. Respiratory: Denies shortness of breath. Gastrointestinal: No abdominal pain.  No nausea, no vomiting.  No diarrhea.  No constipation. Genitourinary: Negative for dysuria. Musculoskeletal: Negative for back pain. Skin: Positive for rash.   ____________________________________________   PHYSICAL EXAM:  VITAL SIGNS: ED Triage Vitals  Enc Vitals Group     BP 10/21/17 1221 106/69     Pulse Rate 10/21/17 1221 74     Resp 10/21/17 1221 18     Temp 10/21/17 1221 98.4 F (36.9 C)     Temp Source 10/21/17 1221 Oral     SpO2 10/21/17 1221 100 %     Weight 10/21/17 1218 194 lb (88 kg)     Height 10/21/17 1218  (1.626 m)  Head Circumference --      Peak Flow --      Pain Score 10/21/17 1218 8     Pain Loc --      Pain Edu? --      Excl. in GC? --     Constitutional: Alert and oriented. Well appearing and in no acute distress. ENT      Head: Normocephalic and atraumatic. Cardiovascular: Normal rate, regular rhythm. Grossly normal heart sounds.  Good peripheral circulation. Respiratory: Normal respiratory effort without tachypnea nor retractions. Breath sounds are clear and equal bilaterally. No wheezes, rales, rhonchi. Musculoskeletal:  Steady gait. Bilateral distal radial  pulses equal and easily palpated.  Bilateral hand grip strong, right hand grip slightly weaker than left. Neurologic:  Normal speech and language. No gross focal neurologic deficits are appreciated. Speech is normal. No gait instability.  Skin:  Skin is warm, dry except:    Right upper extremity as depicted in pictures, superficial crusted area to upper arm with further surrounding erythema, diffuse tenderness and mild swelling, no induration, no fluctuance, no drainage, erythema circumferential, no point bony tenderness.  Right upper extremity with full range of motion present, no motor or tendon deficit noted.   bilateral upper extremities measured, right upper 1 cm greater than left.  Psychiatric: Mood and affect are normal. Speech and behavior are normal. Patient exhibits appropriate insight and judgment   ___________________________________________   LABS (all labs ordered are listed, but only abnormal results are displayed)  Labs Reviewed - No data to display ____________________________________________  RADIOLOGY  US Venous Img Upper Uni Right  Result Date: 10/21/2017 CLINICAL DATA:  27 year old female with right upper extremity swelling and redness for the past 3 days. EXAM: RIGHT UPPER EXTREMITY VENOUS DOPPLER ULTRASOUND TECHNIQUE: Gray-scale sonography with graded compression, as well as color Doppler and duplex ultrasound were performed to evaluate the upper extremity deep venous system from the level of the subclavian vein and including the jugular, axillary, basilic, radial, ulnar and upper cephalic vein. Spectral Doppler was utilized to evaluate flow at rest and with distal augmentation maneuvers. COMPARISON:  None. FINDINGS: Contralateral Subclavian Vein: Respiratory phasicity is normal and symmetric with the symptomatic side. No evidence of thrombus. Normal compressibility. Internal Jugular Vein: No evidence of thrombus. Normal compressibility, respiratory phasicity and response  to augmentation. Subclavian Vein: No evidence of thrombus. Normal compressibility, respiratory phasicity and response to augmentation. Axillary Vein: No evidence of thrombus. Normal compressibility, respiratory phasicity and response to augmentation. Cephalic Vein: No evidence of thrombus. Normal compressibility, respiratory phasicity and response to augmentation. Basilic Vein: No evidence of thrombus. Normal compressibility, respiratory phasicity and response to augmentation. Brachial Veins: No evidence of thrombus. Normal compressibility, respiratory phasicity and response to augmentation. Radial Veins: No evidence of thrombus. Normal compressibility, respiratory phasicity and response to augmentation. Ulnar Veins: No evidence of thrombus. Normal compressibility, respiratory phasicity and response to augmentation. Venous Reflux:  None visualized. Other Findings:  None visualized. IMPRESSION: No evidence of DVT within the right upper extremity. Electronically Signed   By: Malachy Moan M.D.   On: 10/21/2017 15:22   ____________________________________________   PROCEDURES Procedures    INITIAL IMPRESSION / ASSESSMENT AND PLAN / ED COURSE  Pertinent labs & imaging results that were available during my care of the patient were reviewed by me and considered in my medical decision making (see chart for details).  Well-appearing patient.  No acute distress.  Suspect right upper arm cellulitis.  However with swelling and diffuse tenderness per patient,  will evaluate ultrasound to exclude dvt.  Patient agrees this plan.  We will otherwise treat patient with oral doxycycline, topical Bactroban.  Encourage supportive care and elevation.  Discussed strict follow-up and return parameters.Discussed indication, risks and benefits of medications with patient.  Discussed follow up with Primary care physician this week. Discussed follow up and return parameters including no resolution or any worsening concerns.  Patient verbalized understanding and agreed to plan.   ____________________________________________   FINAL CLINICAL IMPRESSION(S) / ED DIAGNOSES  Final diagnoses:  Cellulitis of right upper arm  Right arm pain     ED Discharge Orders        Ordered    US Venous Img Upper Uni Right     10/21/17 1305    doxycycline (VIBRAMYCIN) 100 MG capsule  2 times daily     10/21/17 1307    mupirocin ointment (BACTROBAN) 2 %     10/21/17 1307       Note: This dictation was prepared with Dragon dictation along with smaller phrase technology. Any transcriptional errors that result from this process are unintentional.         Renford Dills, NP 10/21/17 1450  Addendum: 3:45pm.  Right upper extremity ultrasound negative for deep venous thrombosis per radiology report, report as above.  Called discussed with patient via telephone.  Patient verbalized understanding.    Renford Dills, NP 10/21/17 1558    Renford Dills, NP 10/21/17 1559

## 2020-09-16 ENCOUNTER — Encounter: Payer: Self-pay | Admitting: Emergency Medicine

## 2020-09-16 ENCOUNTER — Emergency Department: Payer: BC Managed Care – PPO

## 2020-09-16 ENCOUNTER — Emergency Department
Admission: EM | Admit: 2020-09-16 | Discharge: 2020-09-16 | Disposition: A | Discharge status: Home / Self Care | Payer: BC Managed Care – PPO | Attending: Emergency Medicine | Admitting: Emergency Medicine

## 2020-09-16 ENCOUNTER — Other Ambulatory Visit: Payer: Self-pay

## 2020-09-16 DIAGNOSIS — Z87891 Personal history of nicotine dependence: Secondary | ICD-10-CM | POA: Insufficient documentation

## 2020-09-16 DIAGNOSIS — J45909 Unspecified asthma, uncomplicated: Secondary | ICD-10-CM | POA: Diagnosis not present

## 2020-09-16 DIAGNOSIS — M546 Pain in thoracic spine: Secondary | ICD-10-CM | POA: Diagnosis not present

## 2020-09-16 DIAGNOSIS — M25511 Pain in right shoulder: Secondary | ICD-10-CM | POA: Insufficient documentation

## 2020-09-16 DIAGNOSIS — Y92410 Unspecified street and highway as the place of occurrence of the external cause: Secondary | ICD-10-CM | POA: Diagnosis not present

## 2020-09-16 MED ORDER — IBUPROFEN 800 MG PO TABS
800.0000 mg | ORAL_TABLET | Freq: Once | ORAL | Status: AC
  Administered 2020-09-16: 800 mg via ORAL
  Filled 2020-09-16: qty 1

## 2020-09-16 MED ORDER — METHOCARBAMOL 500 MG PO TABS: 500.0000 mg | ORAL_TABLET | Freq: Three times a day (TID) | ORAL | 0 refills | Status: AC | PRN

## 2020-09-16 MED ORDER — MELOXICAM 15 MG PO TABS: 15.0000 mg | ORAL_TABLET | Freq: Every day | ORAL | 2 refills | Status: AC
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
## 2020-09-16 NOTE — Discharge Instructions (Addendum)
Take Meloxicam and Robaxin as directed.  

## 2021-09-27 ENCOUNTER — Ambulatory Visit: Payer: BC Managed Care – PPO | Admitting: Podiatry

## 2021-09-27 DIAGNOSIS — M7661 Achilles tendinitis, right leg: Secondary | ICD-10-CM
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
